# Patient Record
Sex: Female | Born: 1955 | Race: White | Hispanic: No | Smoking: Never smoker
Health system: Southern US, Community
[De-identification: ages and names within clinical notes are randomized; demographics above are authoritative.]

---

## 2004-03-12 ENCOUNTER — Ambulatory Visit: Payer: Self-pay

## 2004-03-14 ENCOUNTER — Ambulatory Visit: Payer: Self-pay

## 2006-05-31 ENCOUNTER — Other Ambulatory Visit: Payer: Self-pay

## 2006-05-31 ENCOUNTER — Inpatient Hospital Stay: Payer: Self-pay | Admitting: Surgery

## 2006-05-31 IMAGING — CR DG CHEST 2V
1 series · 2 of 2 positions shown · non-contrast
Comparison: none

REASON FOR EXAM: Abdominal pain, chest pain
COMMENTS:

PROCEDURE:     DXR - DXR CHEST PA (OR AP) AND LATERAL  - May 31, 2006  [DATE]
RESULT:     The lungs are well expanded. The heart is normal in size. The
pulmonary vascularity is not engorged. There is no pleural effusion.

[Series 1: view not recorded · 0.17mm/px · 2 of 2 slices shown]
[im 1/2]
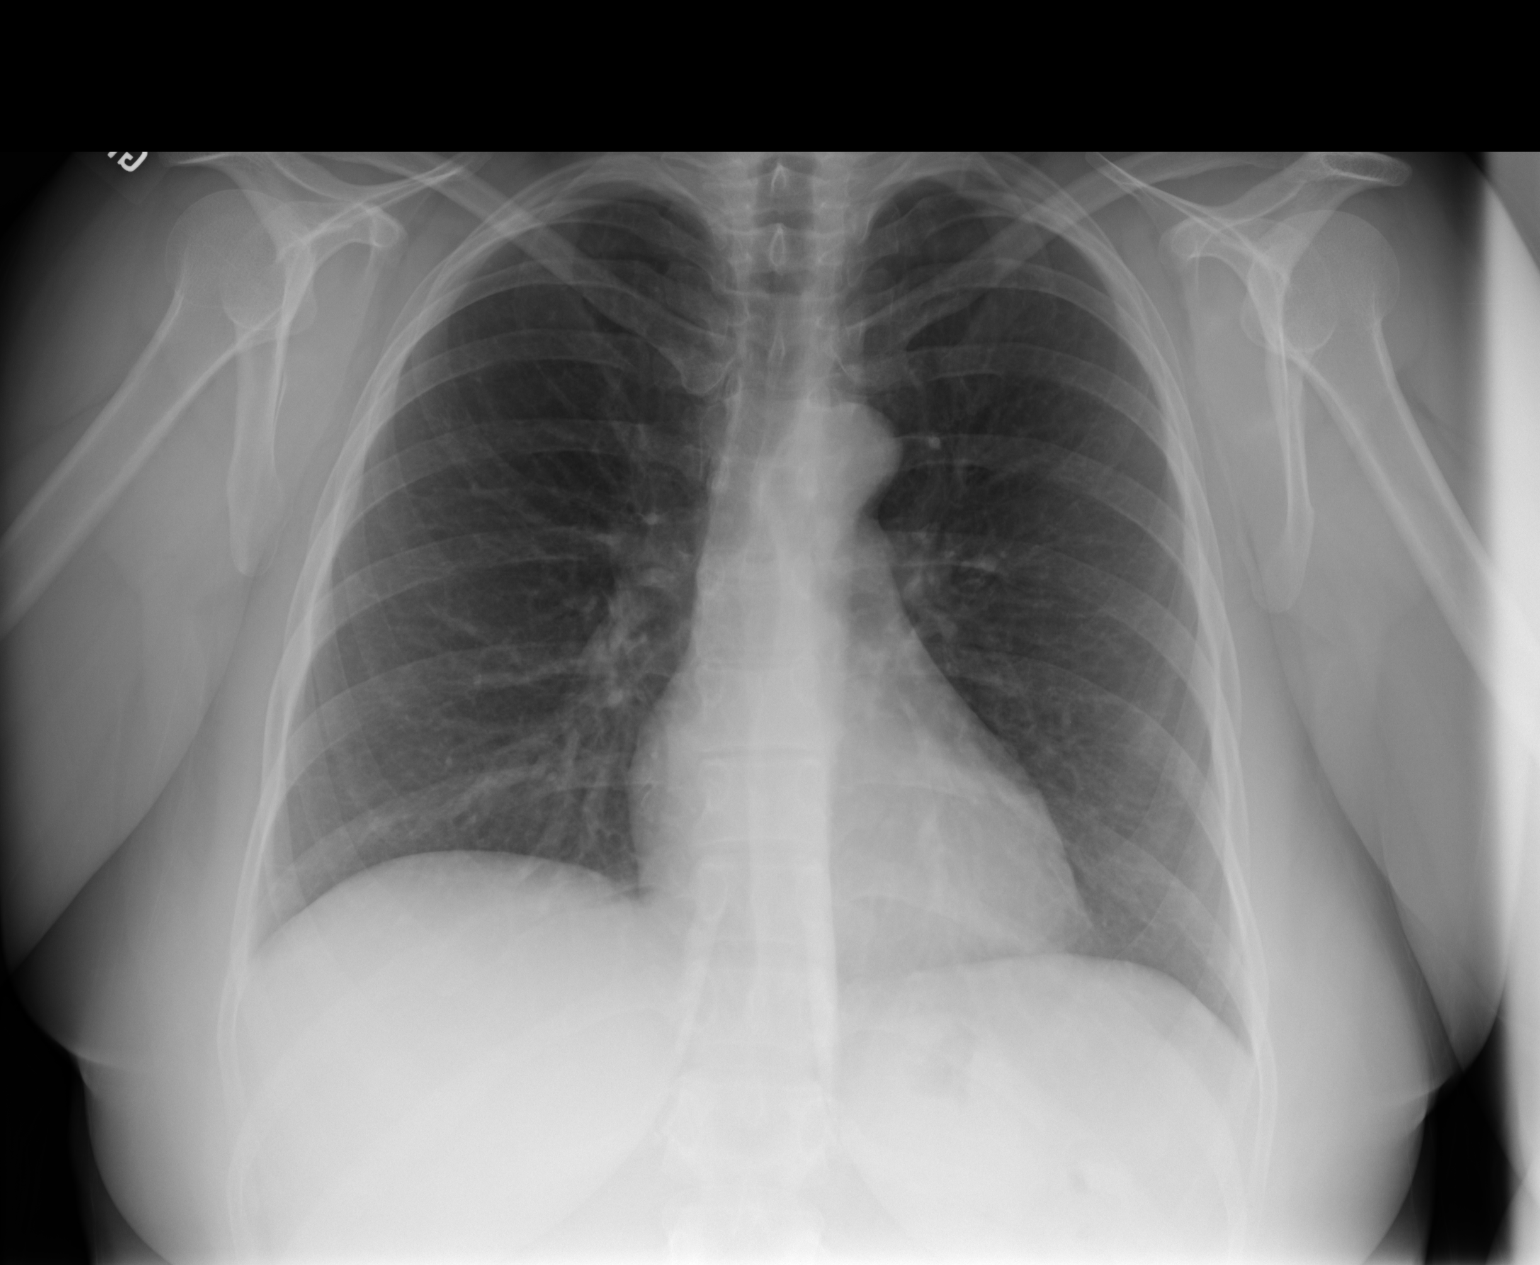
[im 2/2]
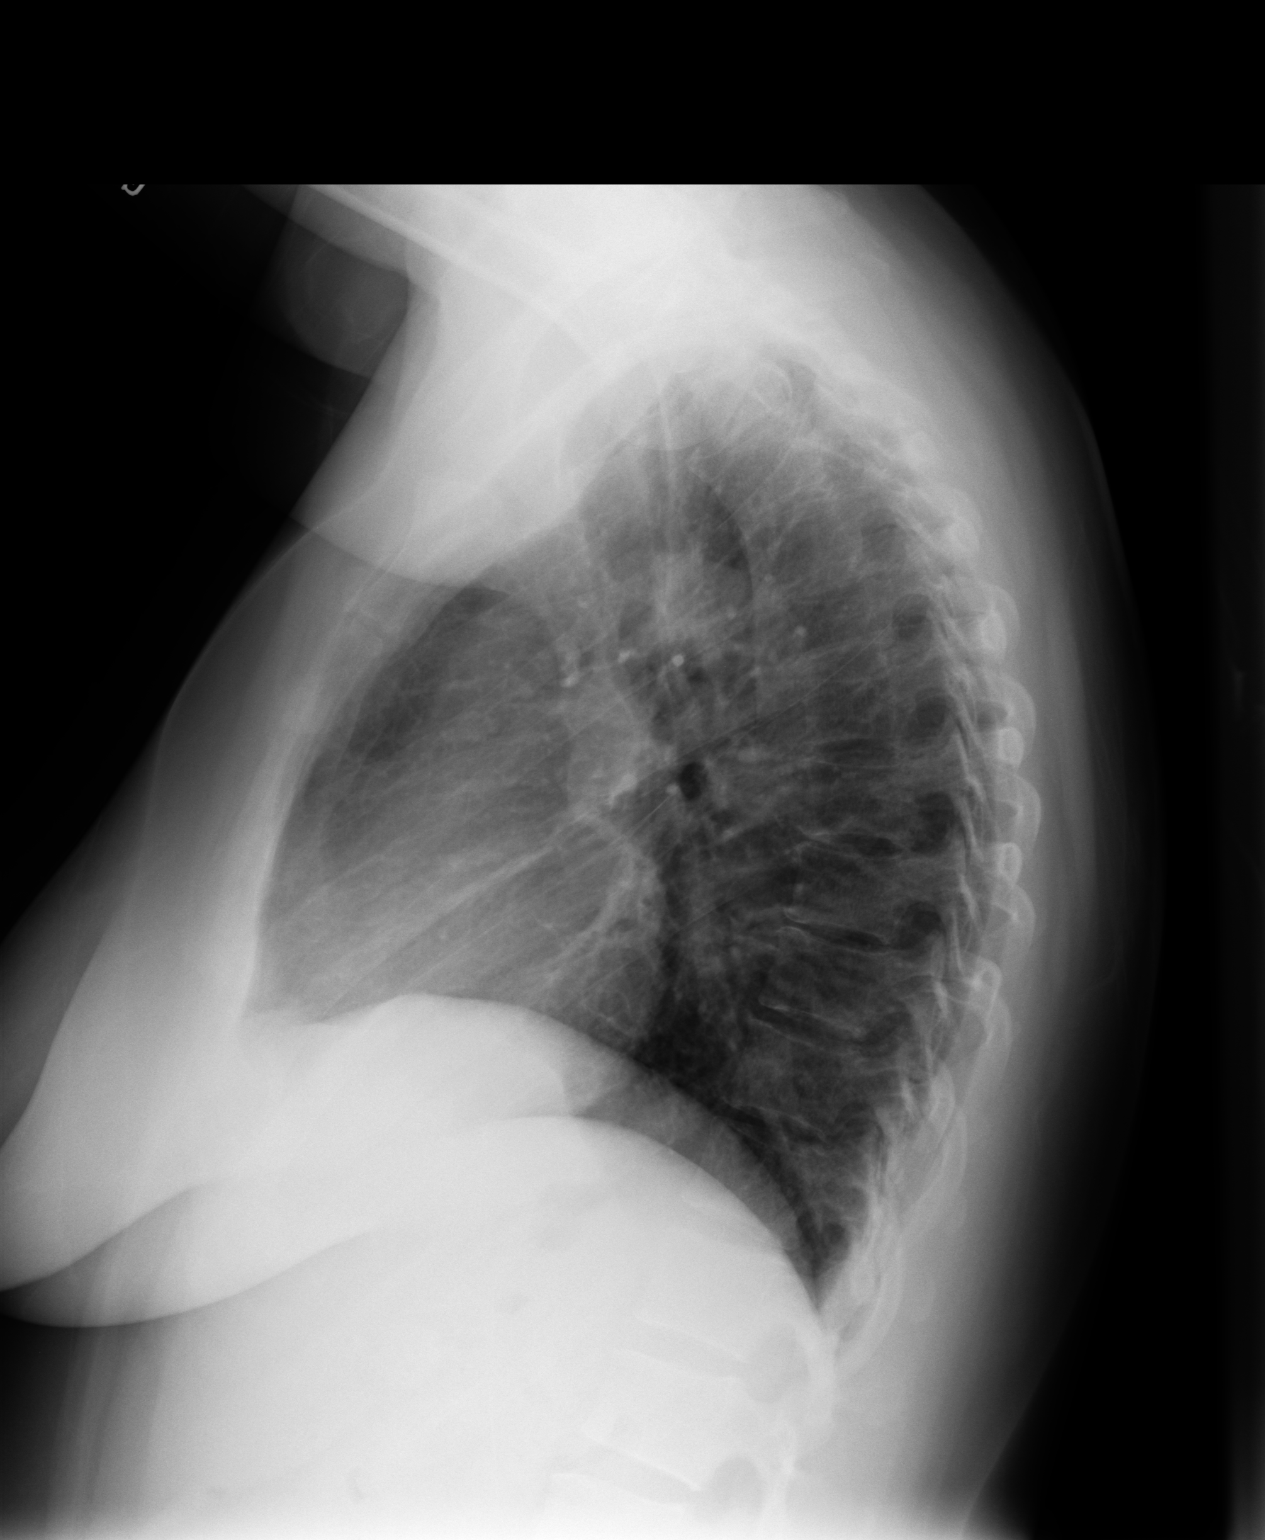

[2 of 2 positions shown; findings below may reference images not displayed]

IMPRESSION: I do not see evidence of pneumonia, CHF or other acute
cardiopulmonary disease. Follow-up films are recommended if the patient has
persistent symptoms.

## 2006-05-31 IMAGING — US ABDOMEN ULTRASOUND
1 series · 17 of 25 positions shown · non-contrast
Comparison: none

REASON FOR EXAM: Abdominal pain, [HOSPITAL]
COMMENTS:

[Series 1: abdomen ultrasound · 17 of 67 slices shown]
[im 1/67]
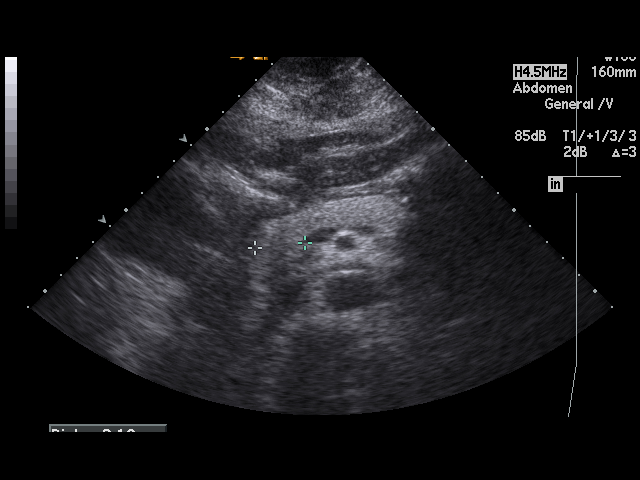
[im 6/67]
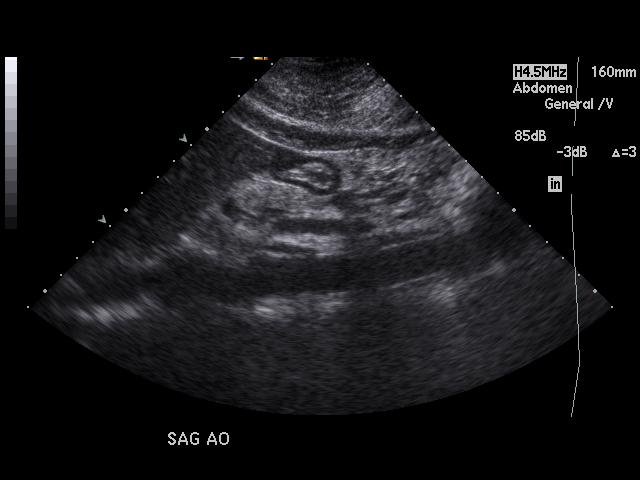
[im 9/67]
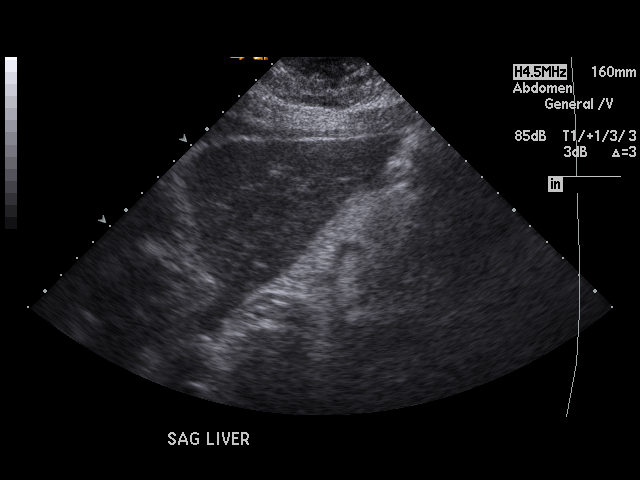
[im 14/67]
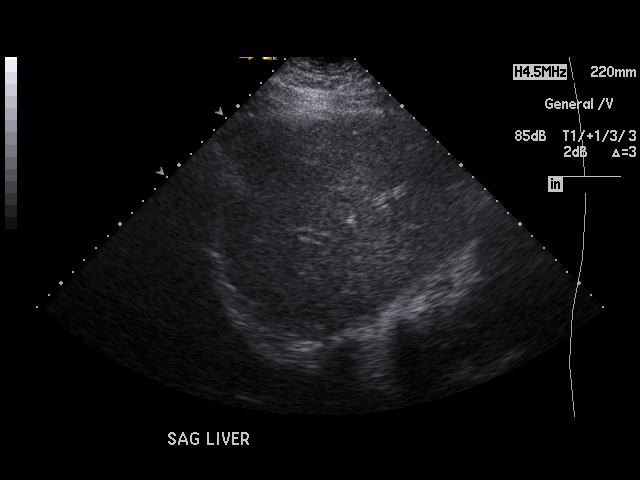
[im 17/67]
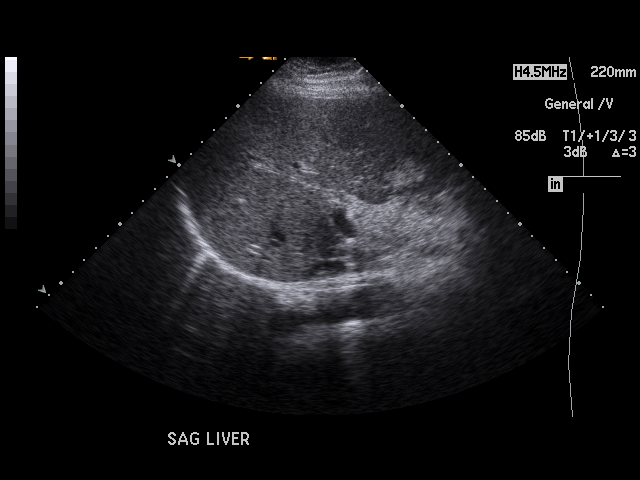
[im 23/67]
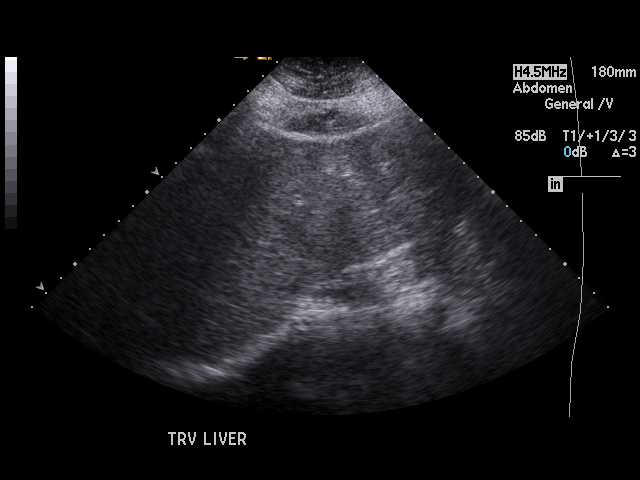
[im 25/67]
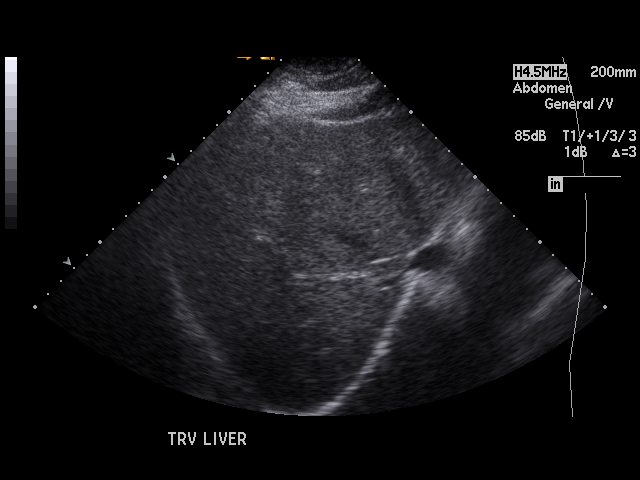
[im 31/67]
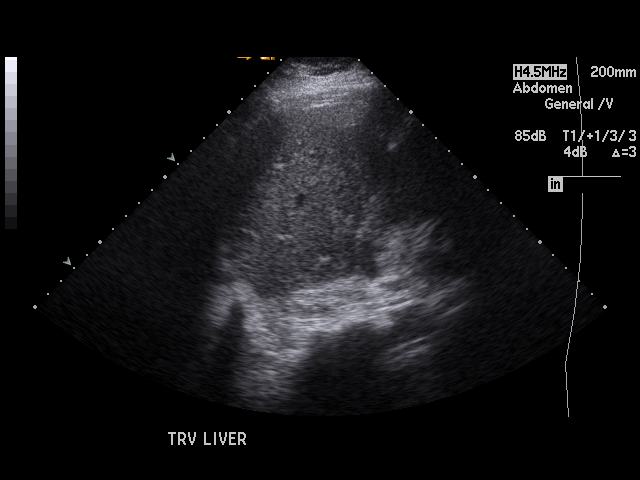
[im 34/67]
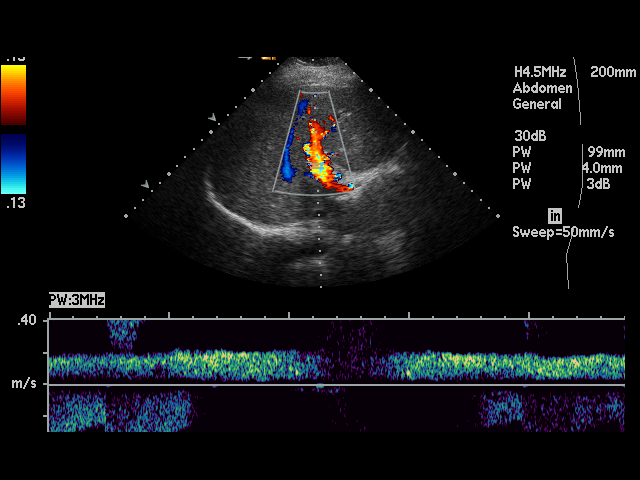
[im 36/67]
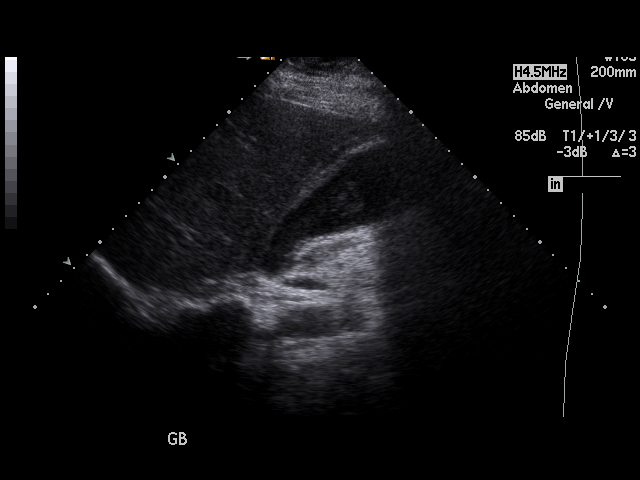
[im 42/67]
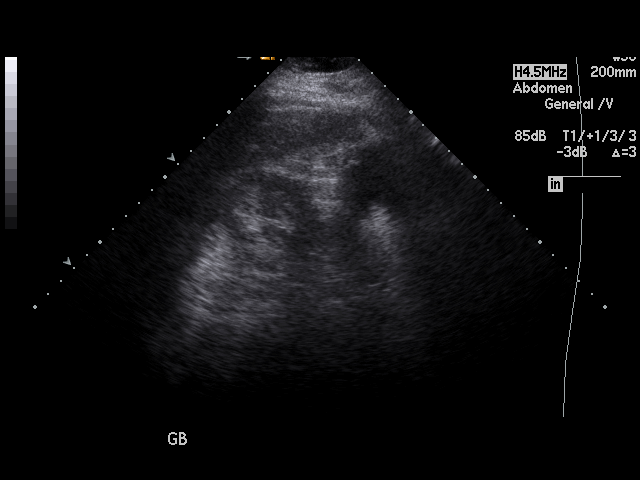
[im 45/67]
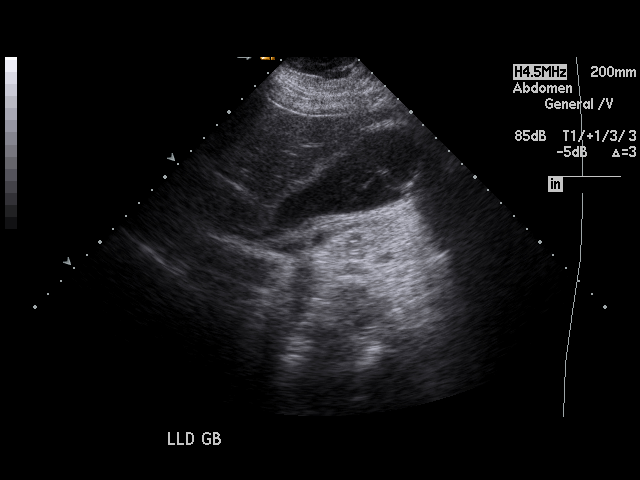
[im 50/67]
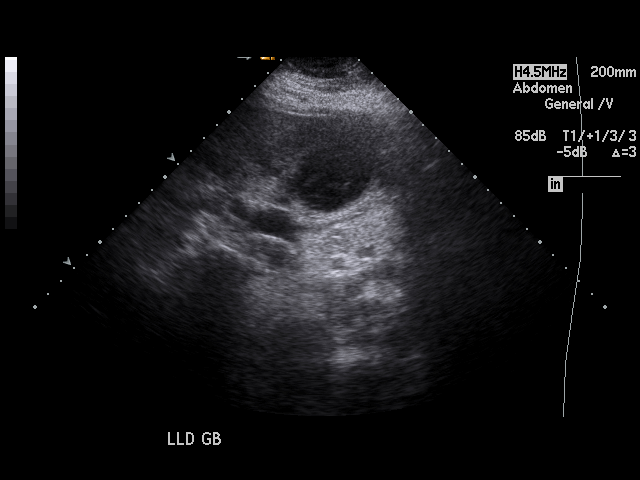
[im 53/67]
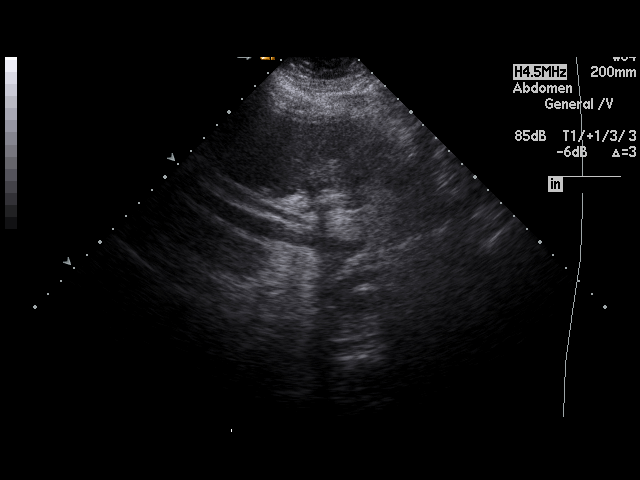
[im 58/67]
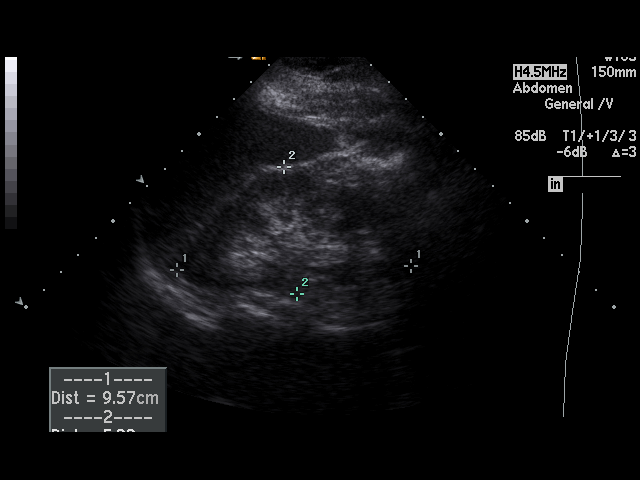
[im 61/67]
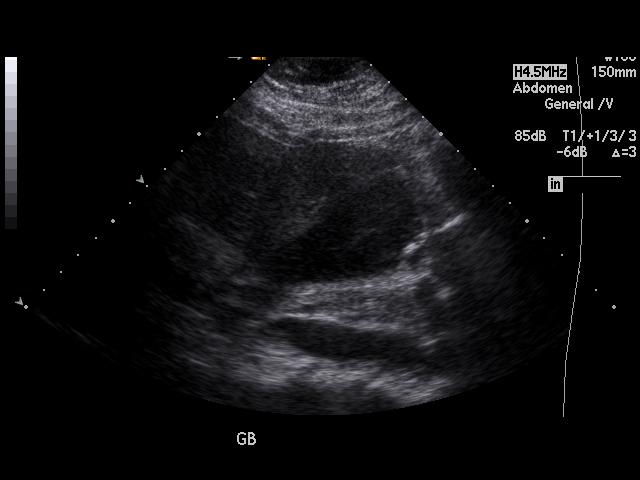
[im 67/67]
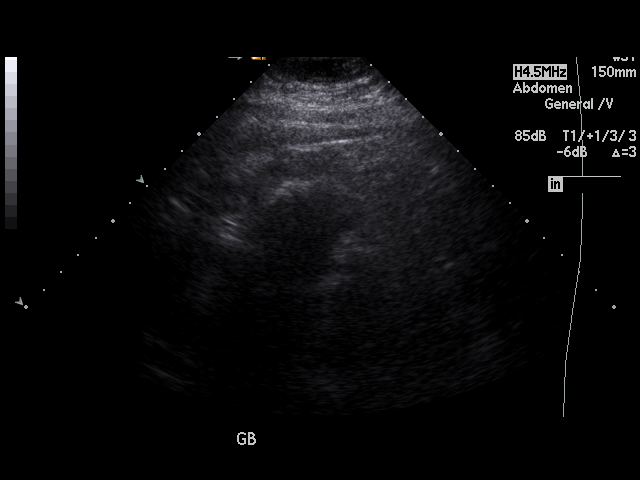

[17 of 25 positions shown; findings below may reference images not displayed]

PROCEDURE:     US  - US ABDOMEN GENERAL SURVEY  - May 31, 2006  [DATE]

RESULT:       The gallbladder is adequately distended and contains echogenic
material compatible with sludge.  The gallbladder wall measures
approximately 4.2 mm which is mildly thickened.   There is a positive
sonographic Murphy sign.   The common bile duct is normal at 3.1 mm in
diameter.  The liver, pancreas, spleen, abdominal aorta and kidneys are
normal in appearance.
IMPRESSION: 1.     There are findings consistent with acute cholecystitis as well as
cholelithiasis with sludge and stones present.  There is gallbladder wall
thickening and a positive sonographic Murphy sign.
2.     Otherwise, the examination is within the limits of normal.

## 2010-06-25 DIAGNOSIS — L739 Follicular disorder, unspecified: Secondary | ICD-10-CM | POA: Insufficient documentation

## 2010-12-15 DIAGNOSIS — E039 Hypothyroidism, unspecified: Secondary | ICD-10-CM | POA: Insufficient documentation

## 2010-12-15 DIAGNOSIS — M79609 Pain in unspecified limb: Secondary | ICD-10-CM | POA: Insufficient documentation

## 2010-12-15 DIAGNOSIS — I10 Essential (primary) hypertension: Secondary | ICD-10-CM | POA: Insufficient documentation

## 2010-12-15 DIAGNOSIS — F319 Bipolar disorder, unspecified: Secondary | ICD-10-CM | POA: Insufficient documentation

## 2010-12-22 DIAGNOSIS — L089 Local infection of the skin and subcutaneous tissue, unspecified: Secondary | ICD-10-CM | POA: Insufficient documentation

## 2010-12-22 DIAGNOSIS — S70269A Insect bite (nonvenomous), unspecified hip, initial encounter: Secondary | ICD-10-CM | POA: Insufficient documentation

## 2010-12-31 IMAGING — CR DG FEET 2 VIEWS BILAT
4 series · 4 of 4 positions shown · non-contrast
Comparison: none

[view not recorded (1 of 4)]
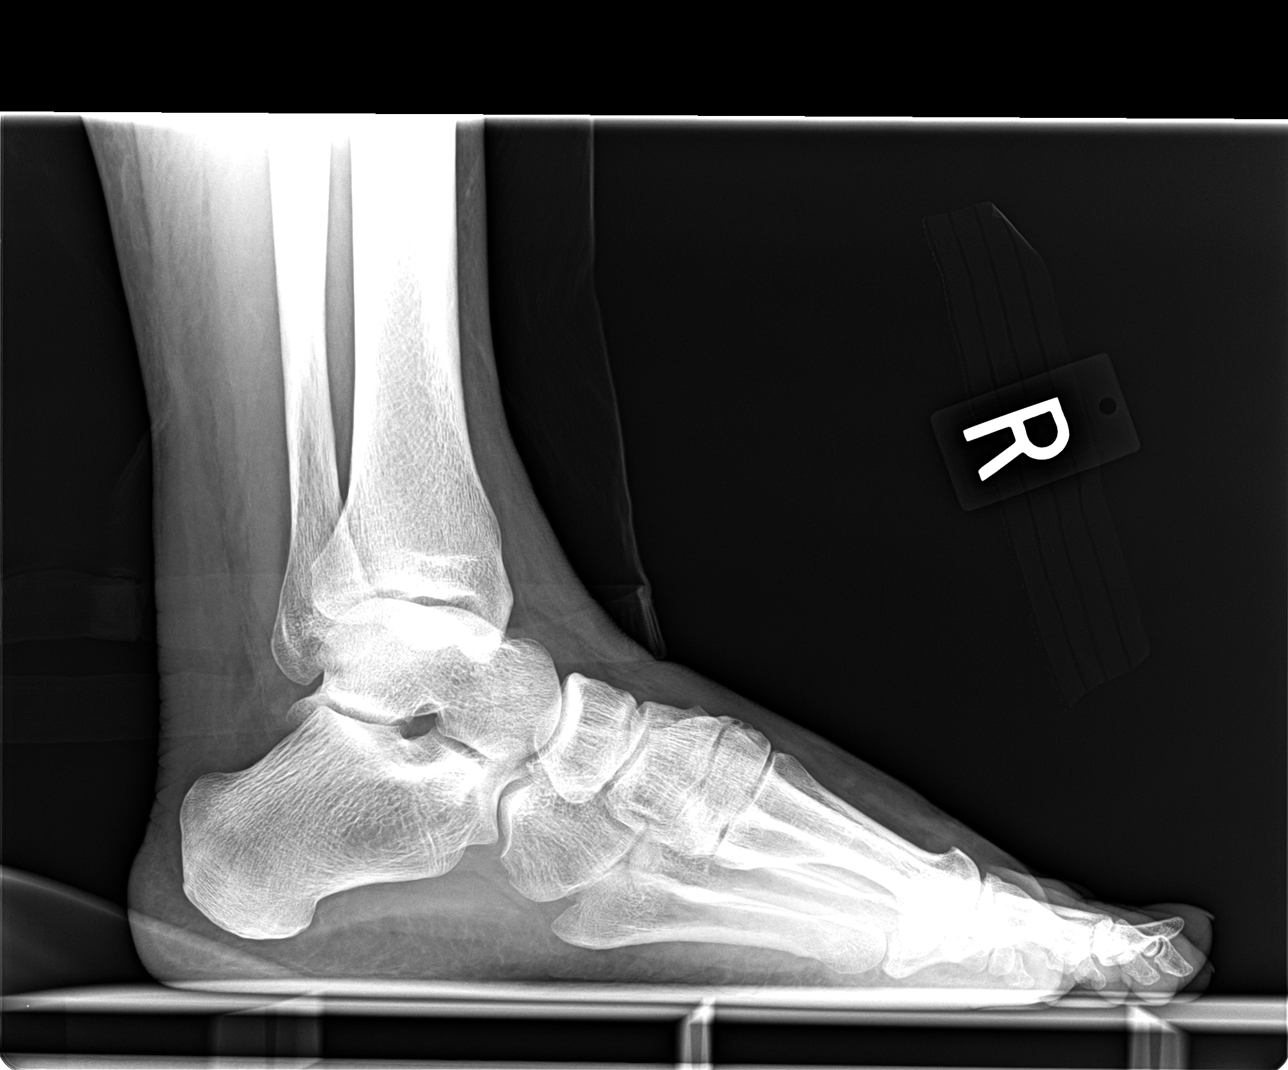

[view not recorded (2 of 4)]
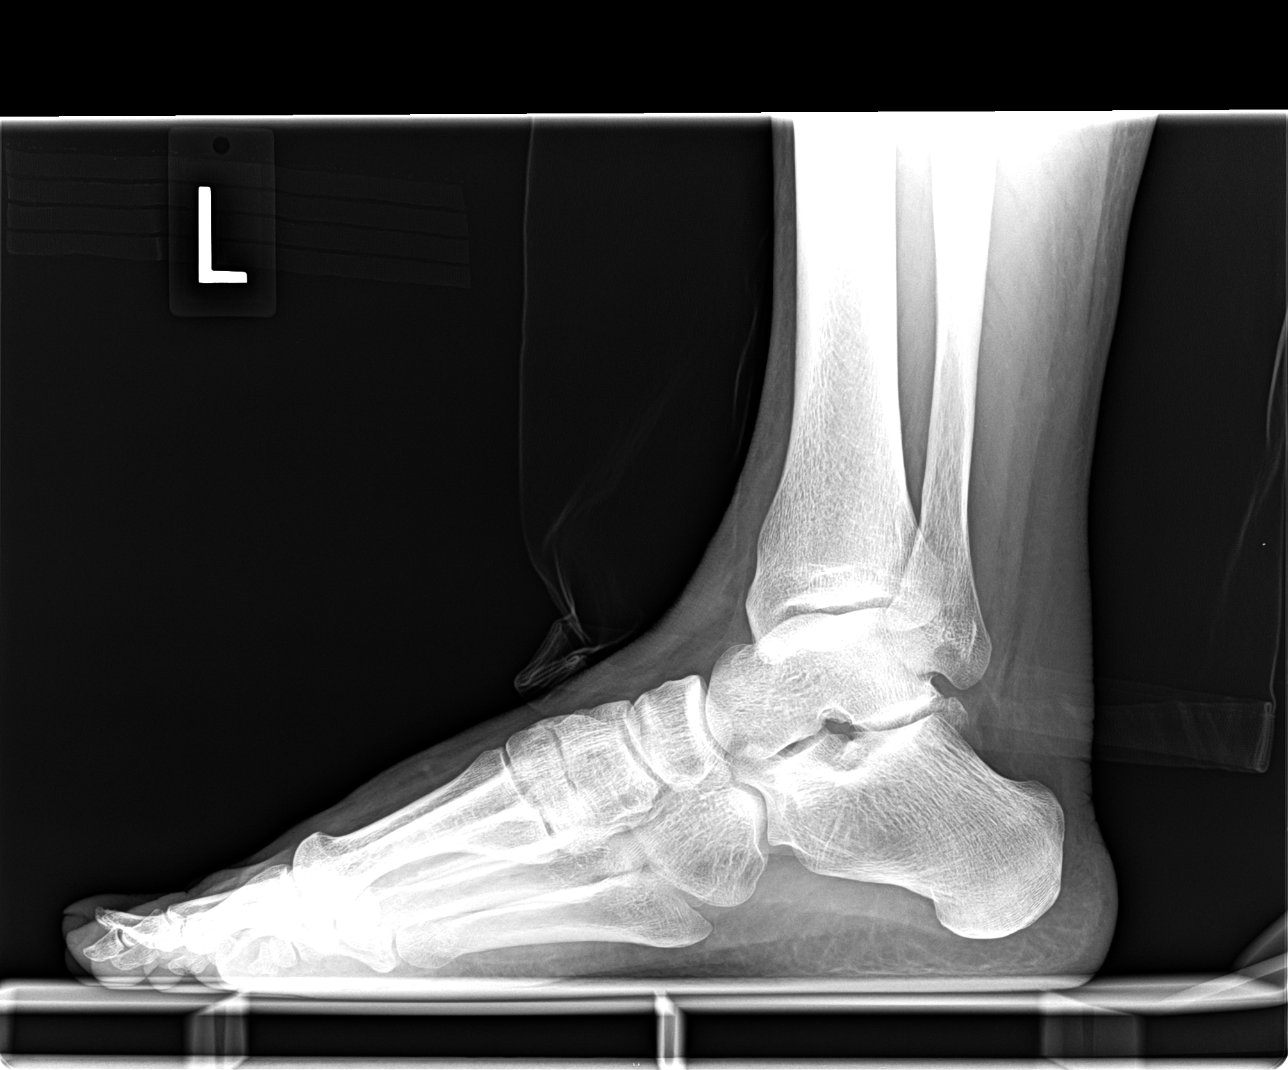

[view not recorded (3 of 4)]
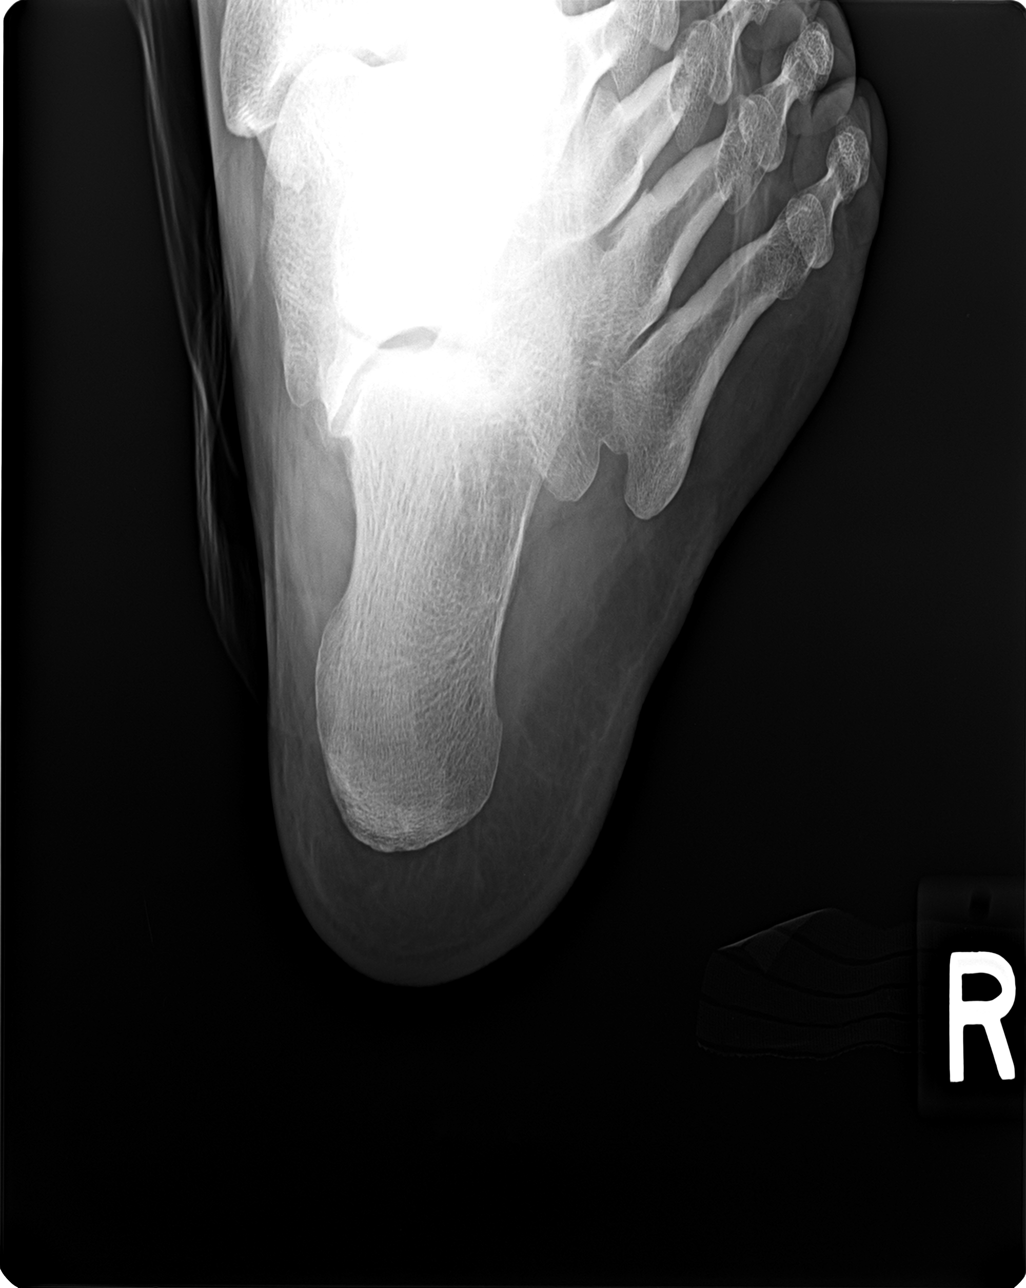

[view not recorded (4 of 4)]
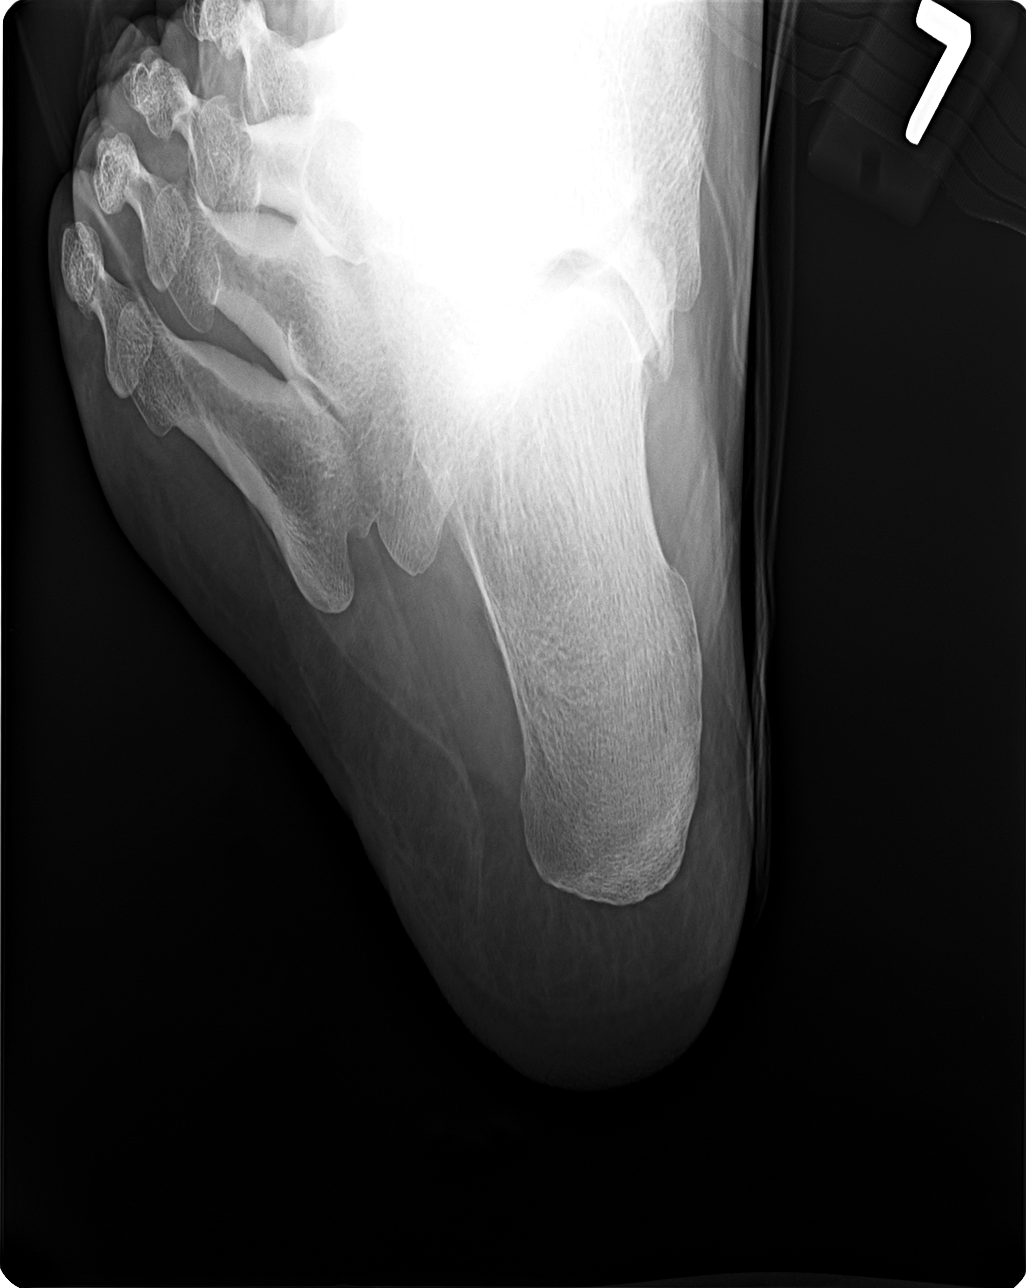

[4 of 4 positions shown; findings below may reference images not displayed]

Canned report from images found in remote index.

Refer to host system for actual result text.

## 2012-01-18 DIAGNOSIS — N950 Postmenopausal bleeding: Secondary | ICD-10-CM | POA: Insufficient documentation

## 2013-03-08 DIAGNOSIS — E782 Mixed hyperlipidemia: Secondary | ICD-10-CM | POA: Insufficient documentation

## 2013-08-02 DIAGNOSIS — R079 Chest pain, unspecified: Secondary | ICD-10-CM | POA: Insufficient documentation

## 2013-08-02 DIAGNOSIS — H938X9 Other specified disorders of ear, unspecified ear: Secondary | ICD-10-CM | POA: Insufficient documentation

## 2014-05-22 DIAGNOSIS — R5383 Other fatigue: Secondary | ICD-10-CM | POA: Insufficient documentation

## 2015-02-22 DIAGNOSIS — G2581 Restless legs syndrome: Secondary | ICD-10-CM | POA: Insufficient documentation

## 2015-08-28 DIAGNOSIS — Z01419 Encounter for gynecological examination (general) (routine) without abnormal findings: Secondary | ICD-10-CM | POA: Insufficient documentation

## 2015-08-28 DIAGNOSIS — G4733 Obstructive sleep apnea (adult) (pediatric): Secondary | ICD-10-CM | POA: Insufficient documentation

## 2015-10-11 DIAGNOSIS — N183 Chronic kidney disease, stage 3 unspecified: Secondary | ICD-10-CM | POA: Insufficient documentation

## 2015-10-11 DIAGNOSIS — Z87898 Personal history of other specified conditions: Secondary | ICD-10-CM | POA: Insufficient documentation

## 2015-10-11 DIAGNOSIS — Z6830 Body mass index (BMI) 30.0-30.9, adult: Secondary | ICD-10-CM

## 2015-10-11 DIAGNOSIS — E6609 Other obesity due to excess calories: Secondary | ICD-10-CM | POA: Insufficient documentation

## 2017-11-23 DIAGNOSIS — S6992XA Unspecified injury of left wrist, hand and finger(s), initial encounter: Secondary | ICD-10-CM | POA: Insufficient documentation

## 2018-03-23 ENCOUNTER — Encounter: Payer: Self-pay | Admitting: Podiatry

## 2018-03-23 ENCOUNTER — Ambulatory Visit (INDEPENDENT_AMBULATORY_CARE_PROVIDER_SITE_OTHER): Payer: BLUE CROSS/BLUE SHIELD

## 2018-03-23 ENCOUNTER — Ambulatory Visit: Payer: BLUE CROSS/BLUE SHIELD | Admitting: Podiatry

## 2018-03-23 ENCOUNTER — Ambulatory Visit: Payer: BLUE CROSS/BLUE SHIELD

## 2018-03-23 VITALS — BP 131/90 | HR 75 | Temp 98.3°F

## 2018-03-23 DIAGNOSIS — S93402A Sprain of unspecified ligament of left ankle, initial encounter: Secondary | ICD-10-CM

## 2018-03-23 DIAGNOSIS — S9002XA Contusion of left ankle, initial encounter: Secondary | ICD-10-CM

## 2018-03-23 DIAGNOSIS — S82832A Other fracture of upper and lower end of left fibula, initial encounter for closed fracture: Secondary | ICD-10-CM

## 2018-03-23 NOTE — Progress Notes (Signed)
  Subjective:  Patient ID: Kara Baker, female    DOB: 1956/03/06,  MRN: [id number] HPI Chief Complaint  Patient presents with  . Ankle Pain    patient presents today for ankle injury.  She states she fell in September and twisted bilat ankles, [facility] diagnosed as [facility].  She reports her right ankle is much better, but her left ankle lateral side is still painful when walking and swells at times.  She states "the pain is a constant sharp, burning pain"  She has been using ice and taking Ibuprofen and rest with slight relief    62 y.o. female presents with the above complaint.   ROS: Denies fever chills nausea vomiting muscle aches pains calf pain back pain chest pain shortness of breath.  No past medical history on file.   Current Outpatient Medications:  .  atorvastatin (LIPITOR) 40 MG tablet, Take by mouth., Disp: , Rfl:  .  enalapril (VASOTEC) 5 MG tablet, Take by mouth., Disp: , Rfl:  .  Omega 3 1000 MG CAPS, Take by mouth., Disp: , Rfl:  .  pramipexole (MIRAPEX) 0.125 MG tablet, Take 1 tablet 2-3 hours before bedtime. After 1 week, may increase to 2 tabs., Disp: , Rfl:  .  FLUoxetine (PROZAC) 40 MG capsule, , Disp: , Rfl: 1 .  levothyroxine (SYNTHROID, LEVOTHROID) 88 MCG tablet, , Disp: , Rfl: 0 .  omeprazole (PRILOSEC) 20 MG capsule, , Disp: , Rfl: 0 .  VICTOZA 18 MG/3ML SOPN, , Disp: , Rfl: 2  No Known Allergies Review of Systems Objective:   Vitals:   03/23/18 1336  BP: 131/90  Pulse: 75  Temp: 98.3 F (36.8 C)    General: Well developed, nourished, in no acute distress, alert and oriented x3   Dermatological: Skin is warm, dry and supple bilateral. Nails x 10 are well maintained; remaining integument appears unremarkable at this time. There are no open sores, no preulcerative lesions, no rash or signs of infection present.  Vascular: Dorsalis Pedis artery and Posterior Tibial artery pedal pulses are 2/4 bilateral with immedate capillary  fill time. Pedal hair growth present. No varicosities and no lower extremity edema present bilateral.   Neruologic: Grossly intact via light touch bilateral. Vibratory intact via tuning fork bilateral. Protective threshold with Semmes Wienstein monofilament intact to all pedal sites bilateral. Patellar and Achilles deep tendon reflexes 2+ bilateral. No Babinski or clonus noted bilateral.   Musculoskeletal: No gross boney pedal deformities bilateral. No pain, crepitus, or limitation noted with foot and ankle range of motion bilateral. Muscular strength 5/5 in all groups tested bilateral.  She has pain on palpation of the distal fibula at the calcaneofibular ligament she also has tenderness on palpation of the anterior talofibular ligament but is intact.  She has no drawer sign.  Gait: Unassisted, Nonantalgic.    Radiographs:  Radiographs taken today of the ankle 3 views left demonstrates an avulsion fracture of the fibula no other fractures are identified.  This looks relatively new it has not had time to remodel as of yet.  Assessment & Plan:   Assessment: Sprained ankle left with fibular avulsion.  Plan: Placed her in a Tri-Lock brace and instructed her to wear this brace for the next 3 to 4 months with shoe gear.  She will follow-up with me if this worsens otherwise as long as is progressively improving she will continue to wear the brace.    Max T. EllijayHyatt, [place]

## 2019-01-10 IMAGING — CR DG CHOLANGIOGRAM OPERATIVE
1 series · 1 of 1 positions shown · non-contrast
Comparison: none

REASON FOR EXAM: Post-op
COMMENTS:

[imported/digitized images]
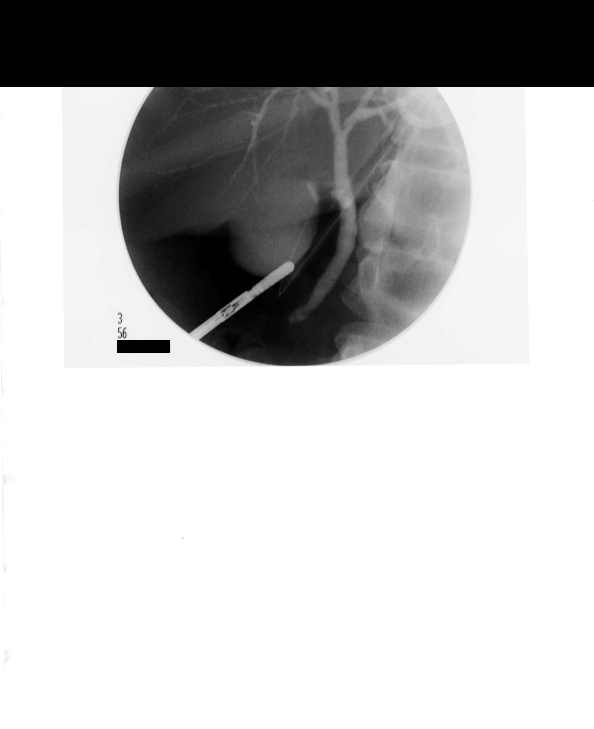

[1 of 1 positions shown; findings below may reference images not displayed]

PROCEDURE:     DXR - DXR CHOLANGIOGRAM OP (INITIAL)  - June 01, 2006  [DATE]

RESULT:     Operative Cholangiogram reveals a normal caliber of the common
bile duct. The observed portions of the common hepatic ducts and the
intrahepatic ducts appear normal. Near the region of the ampulla, subtle
decreased density is seen in the distal portion of the common bile duct.
This may reflect the presence of air bubbles though tiny stones cannot be
excluded.
IMPRESSION: I do not see evidence of obstruction but I cannot absolutely
exclude the possibility of retained stones.

Further interpretation is deferred to Dr. Marmelo.
